# Patient Record
Sex: Male | Born: 1985 | Race: White | Hispanic: No | Marital: Single
Health system: Southern US, Community
[De-identification: ages and names within clinical notes are randomized; demographics above are authoritative.]

---
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
## 2007-01-30 ENCOUNTER — Emergency Department: Payer: Self-pay | Admitting: Emergency Medicine

## 2007-10-03 ENCOUNTER — Emergency Department: Payer: Self-pay | Admitting: Internal Medicine

## 2009-06-08 IMAGING — CR RIGHT ANKLE - COMPLETE 3+ VIEW
1 series · 5 of 5 positions shown · non-contrast
Comparison: none

REASON FOR EXAM: injury/swelling...pt in [place]
COMMENTS:   LMP: (Male)

PROCEDURE:     DXR - DXR ANKLE RIGHT COMPLETE  - January 30, 2007  [DATE]
RESULT:     No fracture, dislocation or other acute bony abnormality is
identified. The ankle mortise is well maintained. There is observed soft
tissue swelling about the lateral malleolus.

[Series 1: view not recorded · 0.17mm/px · 5 of 5 slices shown]
[im 1/5]
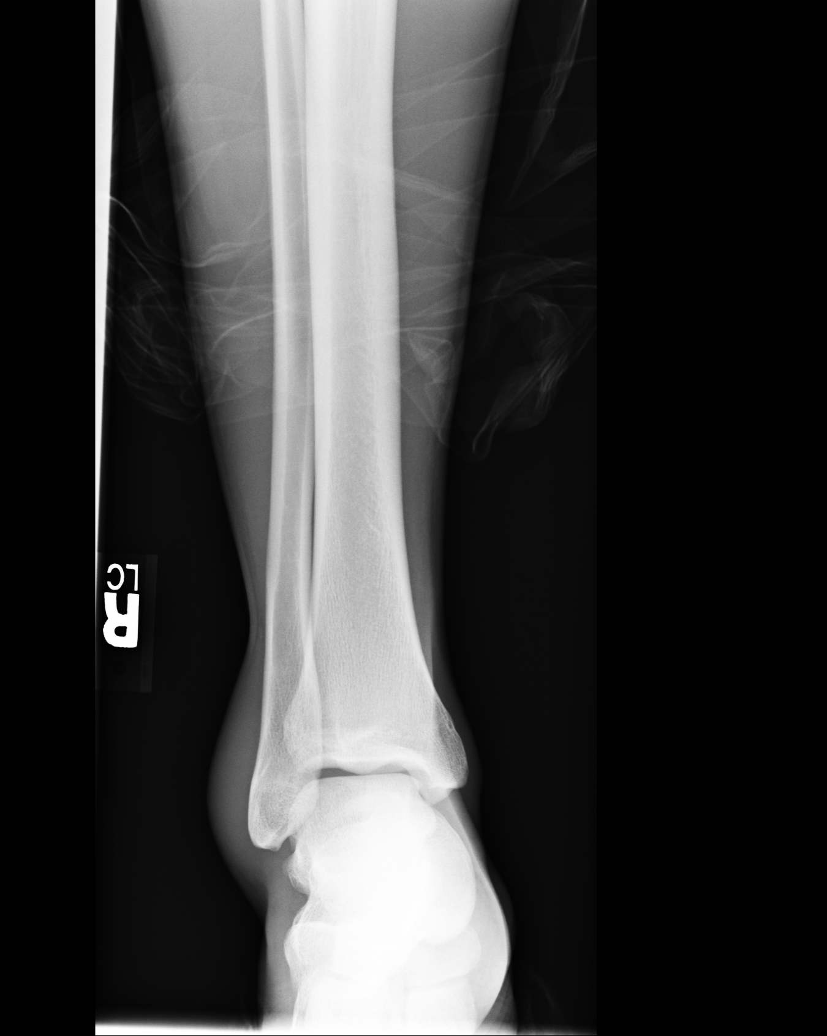
[im 2/5]
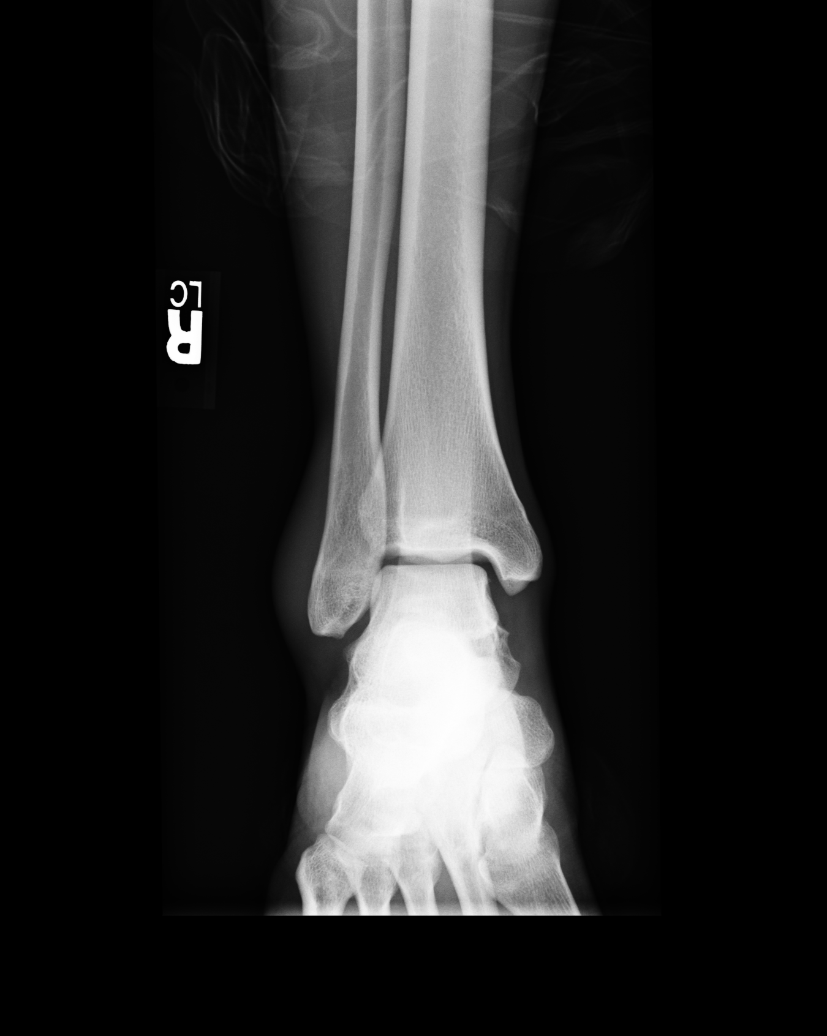
[im 3/5]
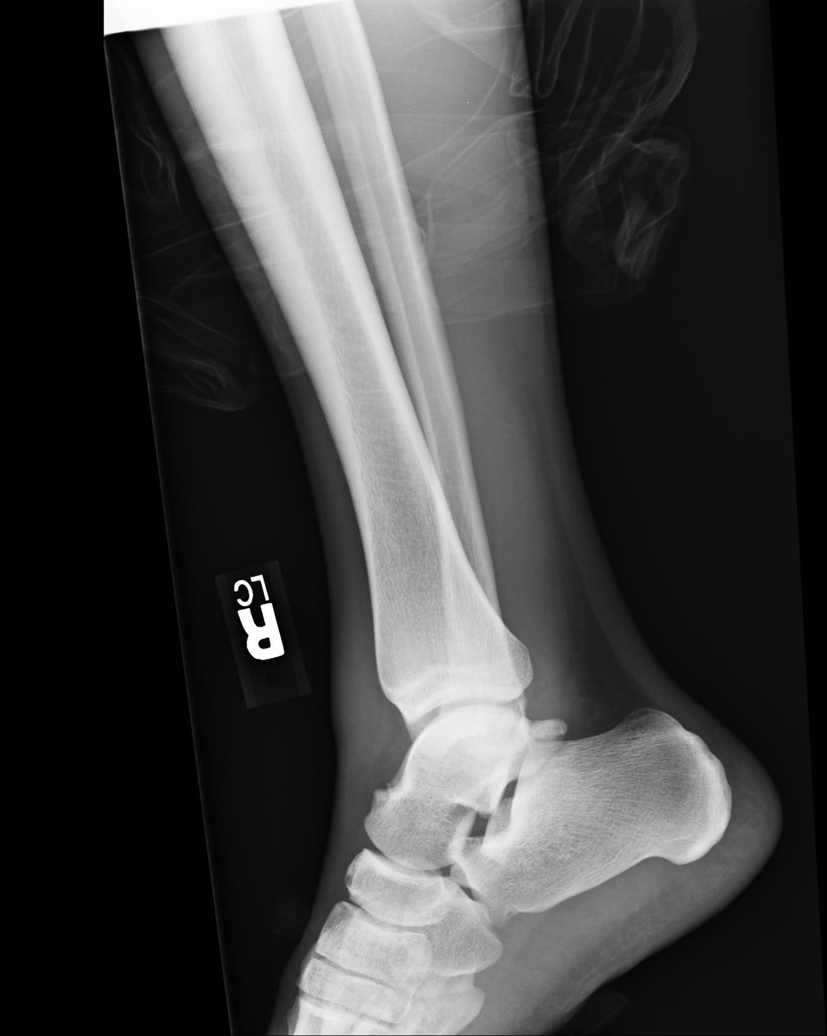
[im 4/5]
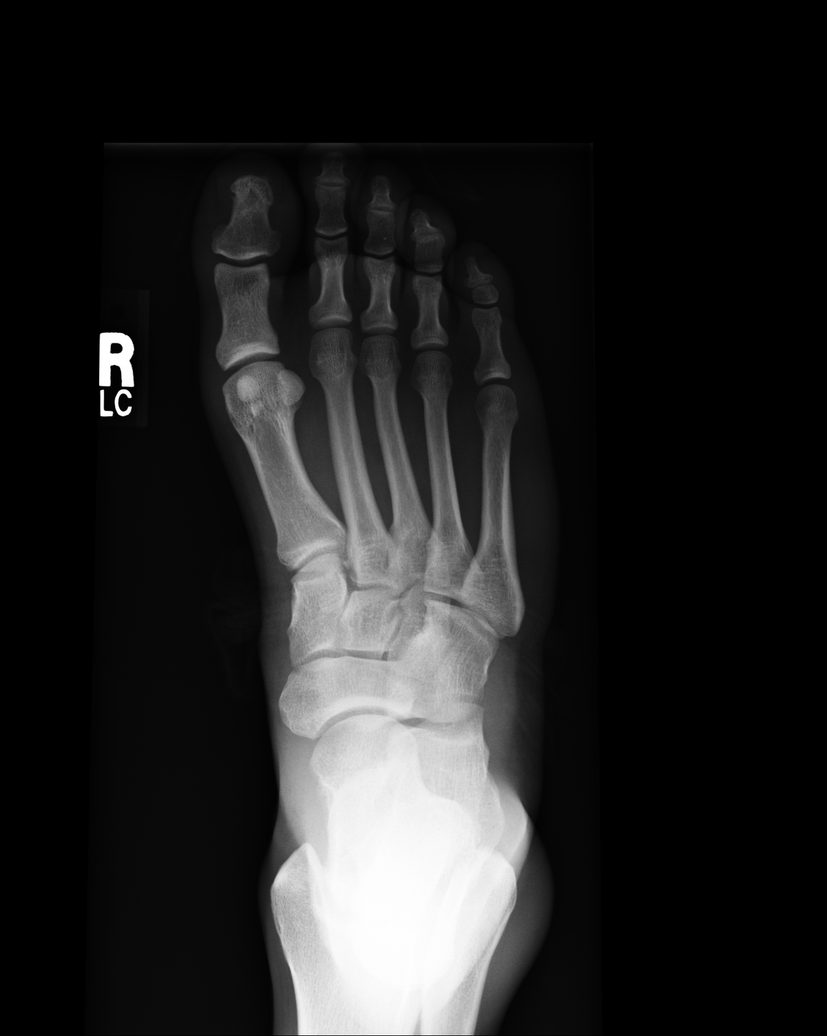
[im 5/5]
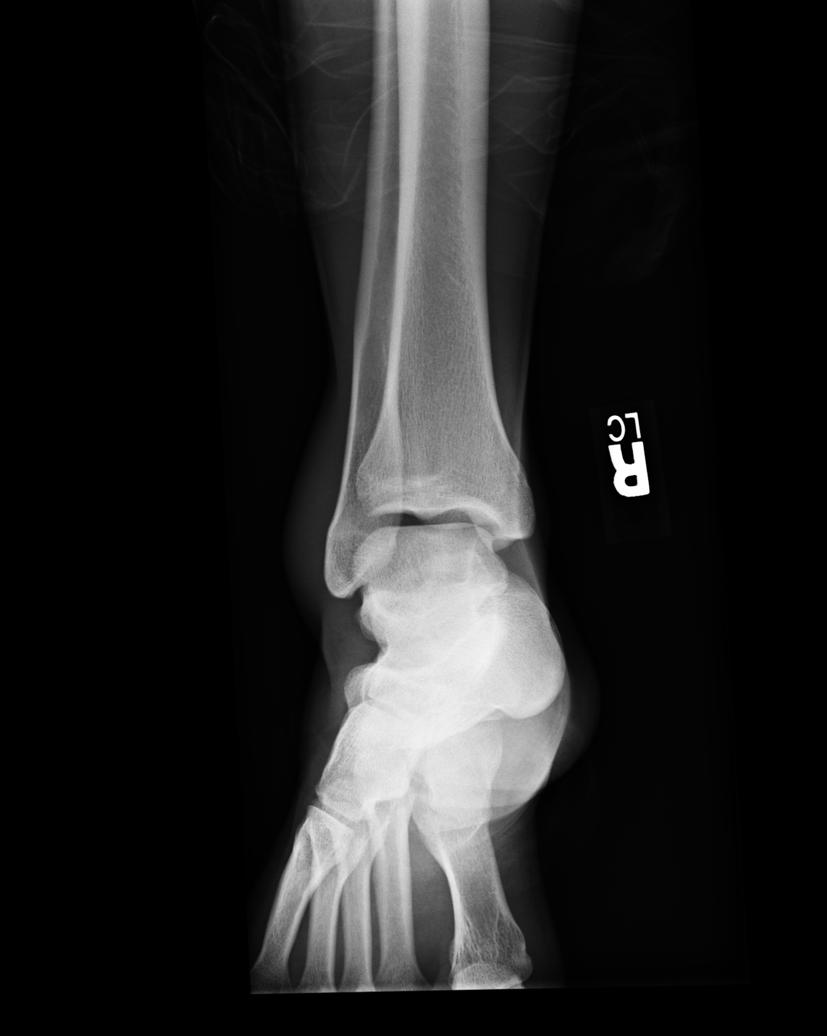

[5 of 5 positions shown; findings below may reference images not displayed]

IMPRESSION: 1. No acute bony abnormalities are identified.

## 2010-02-09 IMAGING — CR RIGHT ANKLE - COMPLETE 3+ VIEW
1 series · 5 of 5 positions shown · non-contrast
Comparison: none

REASON FOR EXAM: fall/injury --   wr
COMMENTS:

[Series 1: view not recorded · 0.17mm/px · 5 of 5 slices shown]
[im 1/5]
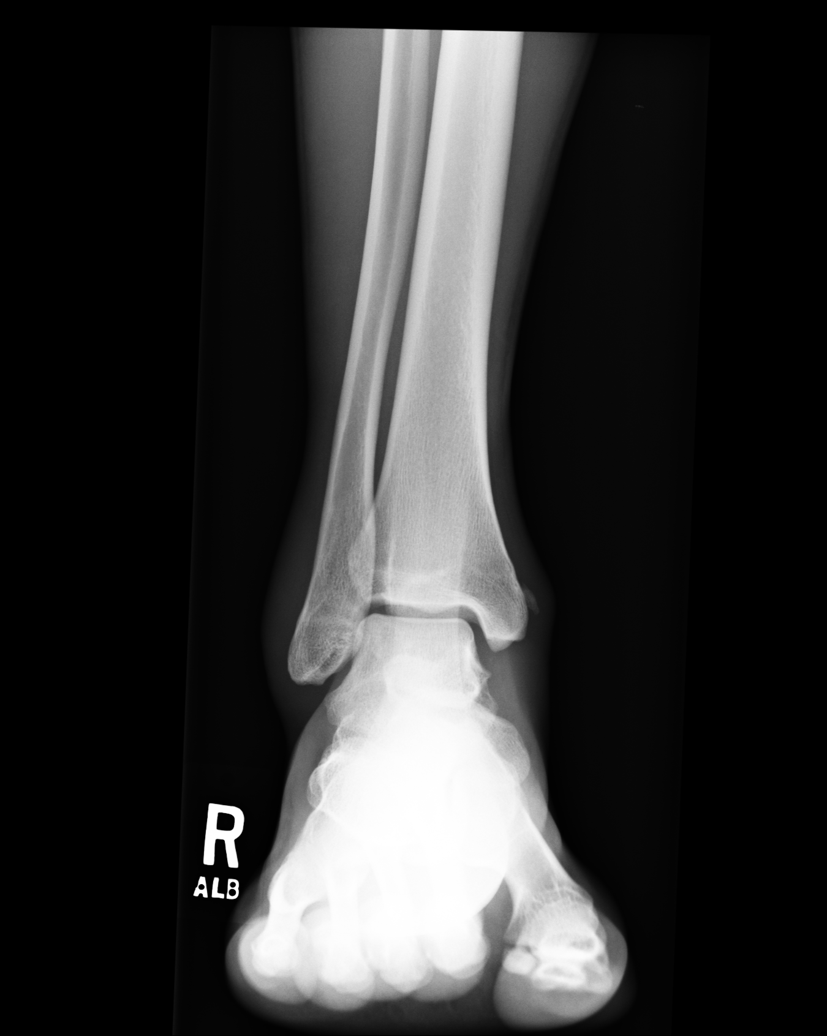
[im 2/5]
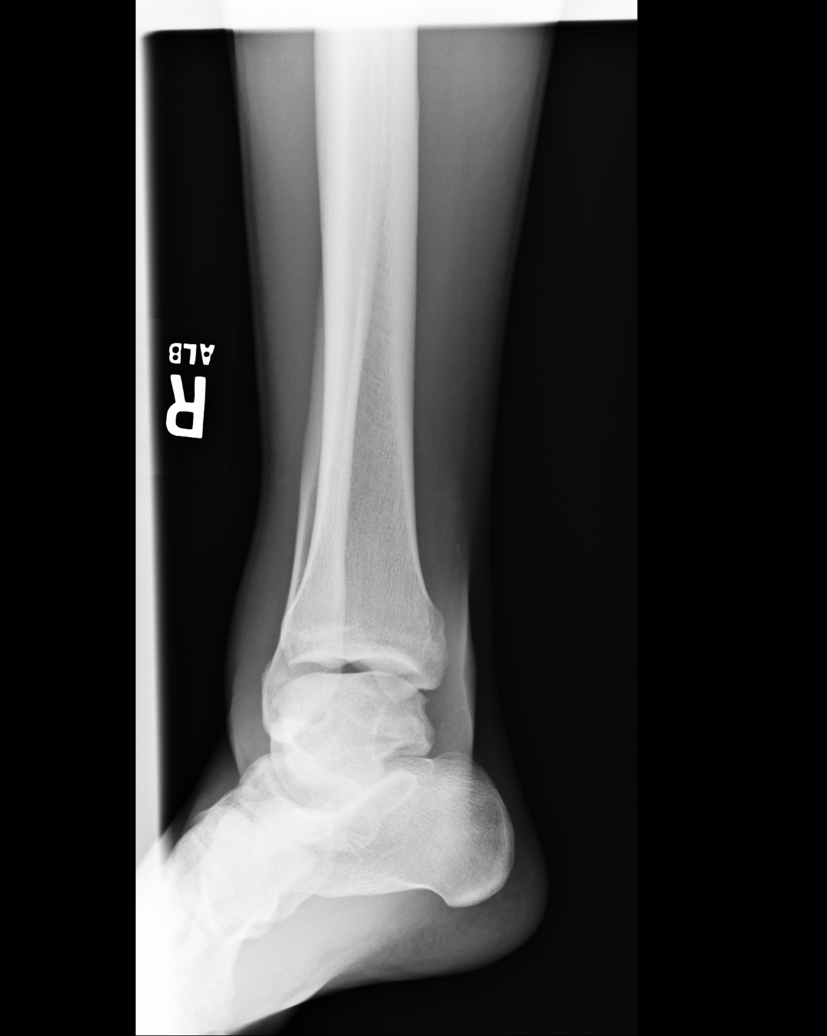
[im 3/5]
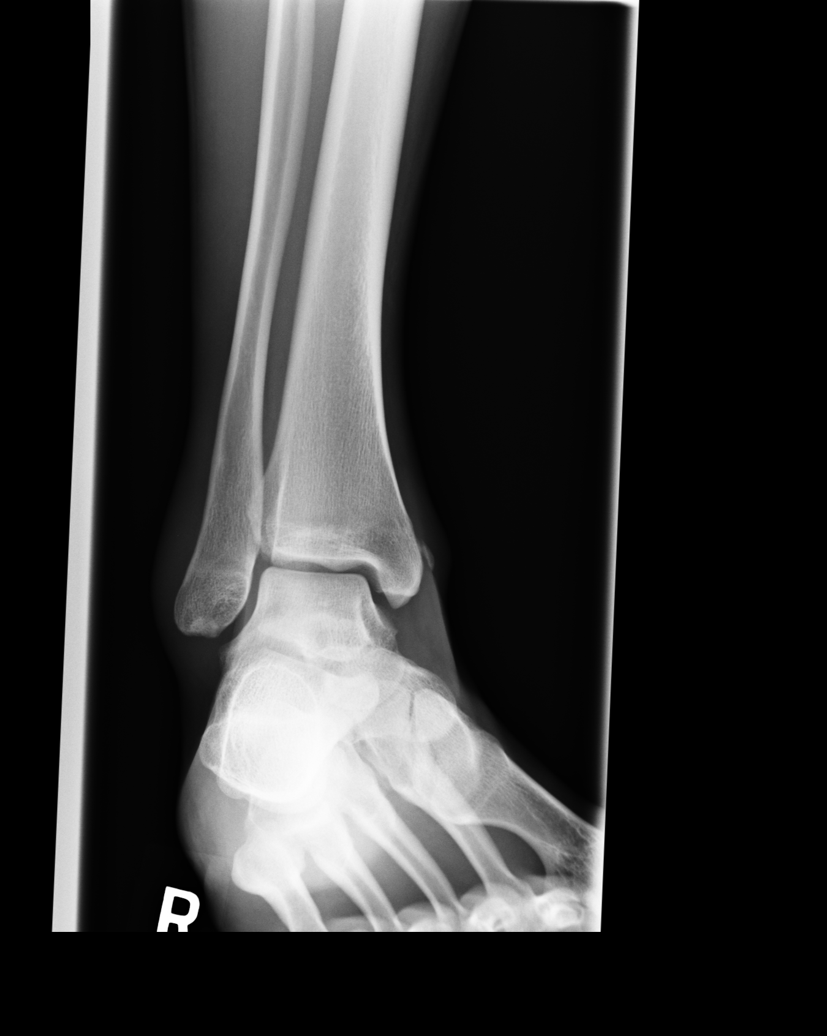
[im 4/5]
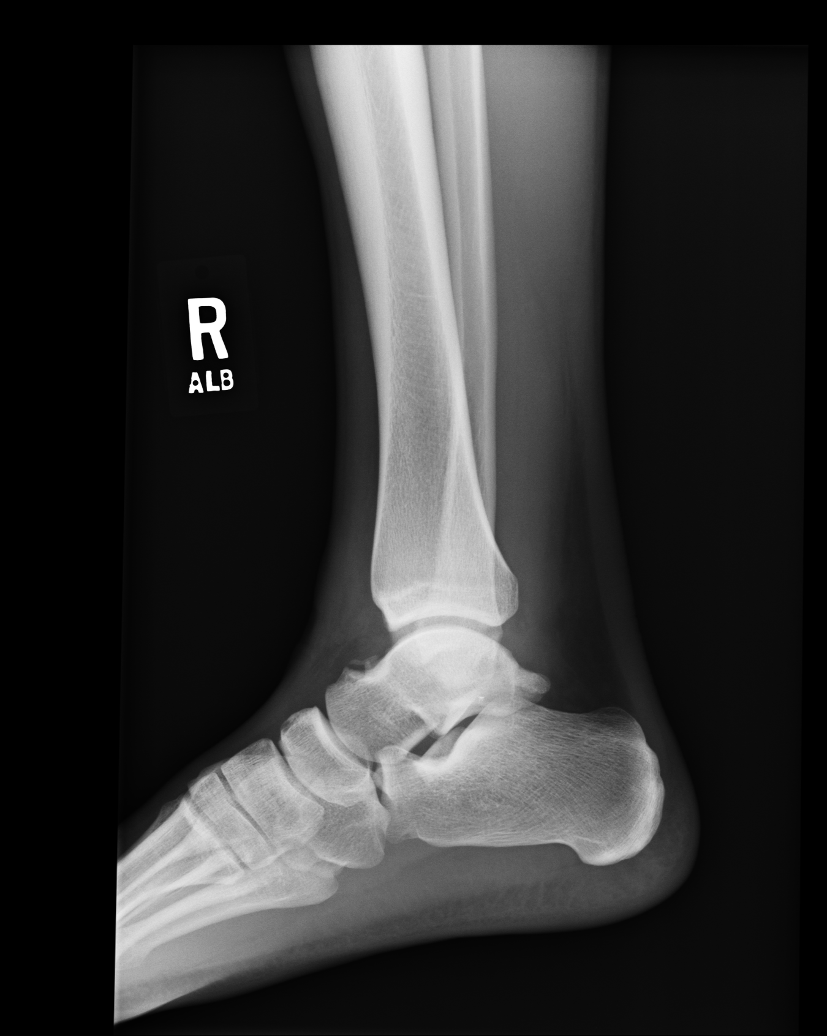
[im 5/5]
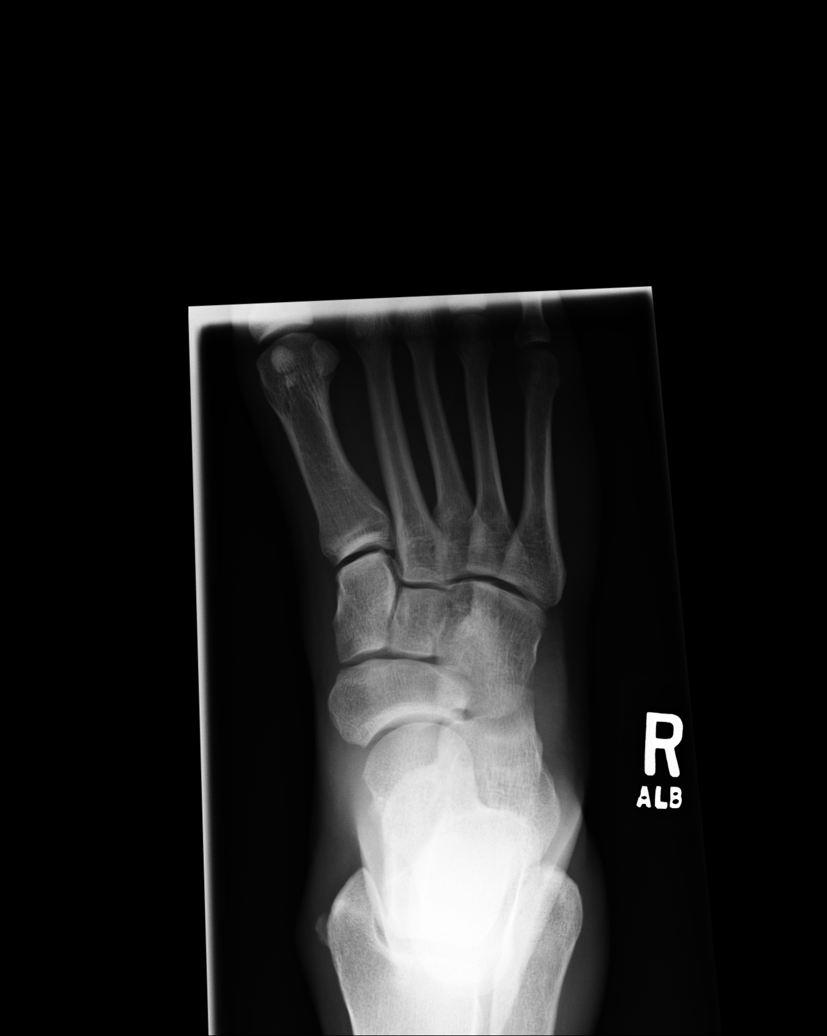

[5 of 5 positions shown; findings below may reference images not displayed]

PROCEDURE:     DXR - DXR ANKLE RIGHT COMPLETE  - October 03, 2007  [DATE]

RESULT:     Five views of the RIGHT ankle were obtained. Comparison is made
to the appearance of the RIGHT ankle on a prior exam of 01/30/07. There is
noted a calcific density adjacent to the medial malleolus. The finding is
consistent with either soft tissue calcification adjacent to the tibia or to
a detached cortical fragment. There does appear to be soft tissue swelling
medially. The ankle mortise is well maintained.
IMPRESSION: Possible chip fracture of the distal tibia versus soft tissue calcification.

## 2019-06-11 ENCOUNTER — Other Ambulatory Visit: Payer: Self-pay

## 2019-06-11 ENCOUNTER — Encounter: Payer: Self-pay | Admitting: Emergency Medicine

## 2019-06-11 ENCOUNTER — Emergency Department
Admission: EM | Admit: 2019-06-11 | Discharge: 2019-06-11 | Disposition: A | Discharge status: Home / Self Care | Payer: Self-pay | Attending: Emergency Medicine | Admitting: Emergency Medicine

## 2019-06-11 DIAGNOSIS — K0889 Other specified disorders of teeth and supporting structures: Secondary | ICD-10-CM | POA: Insufficient documentation

## 2019-06-11 DIAGNOSIS — Z5321 Procedure and treatment not carried out due to patient leaving prior to being seen by health care provider: Secondary | ICD-10-CM | POA: Insufficient documentation

## 2019-06-11 MED ORDER — AMOXICILLIN 500 MG PO CAPS
500.0000 mg | ORAL_CAPSULE | Freq: Once | ORAL | Status: DC
  Filled 2019-06-11: qty 1

## 2019-06-11 MED ORDER — KETOROLAC TROMETHAMINE 30 MG/ML IJ SOLN
30.0000 mg | Freq: Once | INTRAMUSCULAR | Status: DC
  Filled 2019-06-11: qty 1

## 2019-06-11 NOTE — ED Triage Notes (Signed)
Pt reports pain to right lower tooth for several days.

## 2019-06-11 NOTE — ED Notes (Signed)
Late entry 1600   Provider in room

## 2019-06-11 NOTE — ED Notes (Signed)
See triage note States he is having right lower dental pain  States pain started about 1 week ago  Became worse over the past 2 dyas

## 2019-06-11 NOTE — ED Provider Notes (Signed)
Emergency Department Provider Note  ____________________________________________  Time seen: Approximately 4:09 PM  I have reviewed the triage vital signs and the nursing notes.   HISTORY  Chief Complaint Dental Pain   Historian Patient     HPI Stanley Barrett is a 34 y.o. male presents to the emergency department with dental pain of inferior 30 and 31.  Patient states that he has multiple broken teeth along the right lower jaw.  No pain underneath the tongue or difficulty swallowing.  No fever or chills noted at home.  Patient has not made an appointment with a local dentist.  No other alleviating measures have been attempted.   History reviewed. No pertinent past medical history.   Immunizations up to date:  Yes.     History reviewed. No pertinent past medical history.  There are no problems to display for this patient.   History reviewed. No pertinent surgical history.  Prior to Admission medications   Not on File    Allergies Patient has no allergy information on record.  No family history on file.  Social History Social History   Tobacco Use  . Smoking status: Not on file  Substance Use Topics  . Alcohol use: Not on file  . Drug use: Not on file     Review of Systems  Constitutional: No fever/chills Eyes:  No discharge ENT: Patient has dental pain. Respiratory: no cough. No SOB/ use of accessory muscles to breath Gastrointestinal:   No nausea, no vomiting.  No diarrhea.  No constipation. Musculoskeletal: Negative for musculoskeletal pain. Skin: Negative for rash, abrasions, lacerations, ecchymosis.   ____________________________________________   PHYSICAL EXAM:  VITAL SIGNS: ED Triage Vitals  Enc Vitals Group     BP 06/11/19 1417 140/86     Pulse Rate 06/11/19 1417 84     Resp 06/11/19 1417 20     Temp 06/11/19 1417 98.6 F (37 C)     Temp Source 06/11/19 1417 Oral     SpO2 06/11/19 1417 100 %     Weight 06/11/19 1414 130 lb (59  kg)     Height 06/11/19 1414 5\' 7"  (1.702 m)     Head Circumference --      Peak Flow --      Pain Score 06/11/19 1414 10     Pain Loc --      Pain Edu? --      Excl. in [place]? --      Constitutional: Alert and oriented. Well appearing and in no acute distress. Eyes: Conjunctivae are normal. PERRL. EOMI. Head: Atraumatic. ENT:      Nose: No congestion/rhinnorhea.      Mouth/Throat: Mucous membranes are moist.  Patient has inferior 30 and inferior 31 pain. Neck: No stridor.  No cervical spine tenderness to palpation.  Cardiovascular: Normal rate, regular rhythm. Normal S1 and S2.  Good peripheral circulation. Respiratory: Normal respiratory effort without tachypnea or retractions. Lungs CTAB. Good air entry to the bases with no decreased or absent breath sounds Musculoskeletal: Full range of motion to all extremities. No obvious deformities noted Neurologic:  Normal for age. No gross focal neurologic deficits are appreciated.  Skin:  Skin is warm, dry and intact. No rash noted. Psychiatric: Mood and affect are normal for age. Speech and behavior are normal.   ____________________________________________   LABS (all labs ordered are listed, but only abnormal results are displayed)  Labs Reviewed - No data to display ____________________________________________  EKG   ____________________________________________  RADIOLOGY  No results found.  ____________________________________________    PROCEDURES  Procedure(s) performed:     Procedures     Medications  amoxicillin (AMOXIL) capsule 500 mg (500 mg Oral Not Given 06/11/19 1649)  ketorolac (TORADOL) 30 MG/ML injection 30 mg (30 mg Intramuscular Not Given 06/11/19 1649)     ____________________________________________   INITIAL IMPRESSION / ASSESSMENT AND PLAN / ED COURSE  Pertinent labs & imaging results that were available during my care of the patient were reviewed by me and considered in my medical  decision making (see chart for details).      Assessment and plan Dental pain 34 year old male presents to the emergency department with inferior 30 and inferior 31 pain.  Toradol and amoxicillin were ordered for patient.  Patient left the emergency department after being evaluated but before medications were received.   ____________________________________________  FINAL CLINICAL IMPRESSION(S) / ED DIAGNOSES  Final diagnoses:  Pain, dental      NEW MEDICATIONS STARTED DURING THIS VISIT:  ED Discharge Orders    None          This chart was dictated using voice recognition software/Dragon. Despite best efforts to proofread, errors can occur which can change the meaning. Any change was purely unintentional.     Gasper Ramakrishnan 06/11/19 2144    Phineas Semen, MD 06/11/19 2219

## 2019-06-11 NOTE — ED Notes (Signed)
Pt not in room for medication

## 2019-09-23 ENCOUNTER — Ambulatory Visit: Payer: Self-pay | Attending: Internal Medicine

## 2019-09-23 DIAGNOSIS — Z23 Encounter for immunization: Secondary | ICD-10-CM

## 2019-09-23 NOTE — Progress Notes (Signed)
   Covid-[facility] Vaccination [facility]  Name:  ARHUM PEEPLES    MRN: [id number] DOB: 1985/12/18  09/23/2019  Mr. Siemen was observed post Covid-19 immunization for 15 minutes without incident. He was provided with Vaccine Information Sheet and instruction to access the V-Safe system.   Mr. Ingrum was instructed to call 911 with any severe reactions post vaccine: Marland Kitchen Difficulty breathing  . Swelling of face and throat  . A fast heartbeat  . A bad rash all over body  . Dizziness and weakness   Immunizations Administered    Name Date Dose VIS Date Route   Pfizer COVID-19 Vaccine 09/23/2019  3:19 PM 0.3 mL 03/20/2018 Intramuscular   Manufacturer: [employer], [employer]   Lot: [id number]   NDC: [address]-3

## 2019-10-14 ENCOUNTER — Ambulatory Visit: Payer: Self-pay
